# Patient Record
Sex: Female | Born: 1990 | Race: White | Hispanic: No | Marital: Single | Smoking: Never smoker
Health system: Southern US, Community
[De-identification: ages and names within clinical notes are randomized; demographics above are authoritative.]

## PROBLEM LIST (undated history)

## (undated) HISTORY — PX: CHOLECYSTECTOMY: SHX55

## (undated) HISTORY — PX: TONSILLECTOMY: SUR1361

## (undated) HISTORY — PX: FEMUR CLOSED REDUCTION: SHX939

---

## 2015-06-13 ENCOUNTER — Emergency Department (HOSPITAL_COMMUNITY): Payer: BLUE CROSS/BLUE SHIELD

## 2015-06-13 ENCOUNTER — Emergency Department (HOSPITAL_COMMUNITY)
Admission: EM | Admit: 2015-06-13 | Discharge: 2015-06-13 | Disposition: A | Discharge status: Home / Self Care | Payer: BLUE CROSS/BLUE SHIELD | Attending: Emergency Medicine | Admitting: Emergency Medicine

## 2015-06-13 ENCOUNTER — Encounter (HOSPITAL_COMMUNITY): Payer: Self-pay | Admitting: Emergency Medicine

## 2015-06-13 DIAGNOSIS — S9002XA Contusion of left ankle, initial encounter: Secondary | ICD-10-CM | POA: Insufficient documentation

## 2015-06-13 DIAGNOSIS — W1839XA Other fall on same level, initial encounter: Secondary | ICD-10-CM | POA: Insufficient documentation

## 2015-06-13 DIAGNOSIS — S82892A Other fracture of left lower leg, initial encounter for closed fracture: Secondary | ICD-10-CM | POA: Insufficient documentation

## 2015-06-13 DIAGNOSIS — Z880 Allergy status to penicillin: Secondary | ICD-10-CM | POA: Diagnosis not present

## 2015-06-13 DIAGNOSIS — Y9289 Other specified places as the place of occurrence of the external cause: Secondary | ICD-10-CM | POA: Insufficient documentation

## 2015-06-13 DIAGNOSIS — Y9389 Activity, other specified: Secondary | ICD-10-CM | POA: Insufficient documentation

## 2015-06-13 DIAGNOSIS — Y998 Other external cause status: Secondary | ICD-10-CM | POA: Diagnosis not present

## 2015-06-13 DIAGNOSIS — S99912A Unspecified injury of left ankle, initial encounter: Secondary | ICD-10-CM | POA: Diagnosis present

## 2015-06-13 MED ORDER — TRAMADOL HCL 50 MG PO TABS: 50.0000 mg | ORAL_TABLET | Freq: Four times a day (QID) | ORAL | Status: AC | PRN

## 2015-06-13 MED ORDER — TRAMADOL HCL 50 MG PO TABS
50.0000 mg | ORAL_TABLET | Freq: Once | ORAL | Status: AC
  Administered 2015-06-13: 50 mg via ORAL
  Filled 2015-06-13: qty 1

## 2015-06-13 NOTE — ED Provider Notes (Signed)
CSN: [id number]     Arrival date & time 06/13/15  1903 History  By signing my name below, I, Cindy Ferrell, attest that this documentation has been prepared under the direction and in the presence of Earley Favor, NP-C Electronically Signed: Phillis Ferrell, ED Scribe. 06/13/2015. 9:30 PM.   Chief Complaint  Patient presents with  . Ankle Injury   The history is provided by the patient. No language interpreter was used.  HPI Comments: Cindy Ferrell is a 25 y.o. female who presents to the Emergency Department complaining of a left ankle injury onset one day ago. Pt states that she fell getting onto a train last night injuring the left ankle. She reports associated bruising and swelling to the ankle and left fifth toe. She has been using RICE techniques, ACE wrap, and ibuprofen with mild relief. She states that she is able to walk with the ACE wrap, but it is difficult. She denies numbness and weakness.   History reviewed. No pertinent past medical history. Past Surgical History  Procedure Laterality Date  . Cholecystectomy    . Femur closed reduction Right   . Tonsillectomy     History reviewed. No pertinent family history. Social History  Substance Use Topics  . Smoking status: Never Smoker   . Smokeless tobacco: None  . Alcohol Use: Yes   OB History    No data available     Review of Systems  Constitutional: Negative for fever.  Musculoskeletal: Positive for joint swelling and arthralgias.  Skin: Positive for color change. Negative for wound.  Neurological: Negative for weakness and numbness.  All other systems reviewed and are negative.  Allergies  Penicillins  Home Medications   Prior to Admission medications   Medication Sig Start Date End Date Taking? Authorizing Provider  etonogestrel (NEXPLANON) 68 MG IMPL implant 1 each by Subdermal route once.   Yes Historical Provider, MD  ibuprofen (ADVIL,MOTRIN) 200 MG tablet Take 400-600 mg by mouth every 6 (six) hours as needed  (for pain).   Yes Historical Provider, MD  traMADol (ULTRAM) 50 MG tablet Take 1 tablet (50 mg total) by mouth every 6 (six) hours as needed. 06/13/15   Earley Favor, NP   BP 137/99 mmHg  Pulse 106  Temp(Src) 98.7 F (37.1 C) (Oral)  Resp 15  Ht  (1.6 m)  Wt 99.791 kg  BMI 38.98 kg/m2  SpO2 97%  LMP 04/12/2015 Physical Exam  Constitutional: She is oriented to person, place, and time. She appears well-developed and well-nourished.  HENT:  Head: Normocephalic.  Eyes: Pupils are equal, round, and reactive to light.  Neck: Normal range of motion.  Cardiovascular: Normal rate.   Pulmonary/Chest: Effort normal.  Musculoskeletal: Normal range of motion. She exhibits edema and tenderness.       Feet:  Neurological: She is alert and oriented to person, place, and time.  Nursing note and vitals reviewed.   ED Course  Procedures (including critical care time) DIAGNOSTIC STUDIES: Oxygen Saturation is 97% on RA, normal by my interpretation.    COORDINATION OF CARE: 9:06 PM-Discussed treatment plan which includes x-ray and ankle brace with pt at bedside and pt agreed to plan.    Labs Review Labs Reviewed - No data to display  Imaging Review Dg Ankle Complete Left  06/13/2015  CLINICAL DATA:  25 year old female with fall and left hip pain. EXAM: LEFT ANKLE COMPLETE - 3+ VIEW; LEFT FOOT - COMPLETE 3+ VIEW COMPARISON:  None. FINDINGS: There is a small irregular  bony fragment adjacent to the distal tip of the lateral malleolus compatible with avulsion injury. Faint linear calcification adjacent to the medial malleolus may represent an age indeterminate avulsion injury or soft tissue calcification. No other acute fracture identified. There is no dislocation. The bones are well mineralized. There is soft tissue swelling over the lateral malleolus. IMPRESSION: Avulsion injury of the distal tip of the lateral malleolus. Electronically Signed   By: Elgie Collard M.D.   On: 06/13/2015 20:12    Dg Foot Complete Left  06/13/2015  CLINICAL DATA:  25 year old female with fall and left hip pain. EXAM: LEFT ANKLE COMPLETE - 3+ VIEW; LEFT FOOT - COMPLETE 3+ VIEW COMPARISON:  None. FINDINGS: There is a small irregular bony fragment adjacent to the distal tip of the lateral malleolus compatible with avulsion injury. Faint linear calcification adjacent to the medial malleolus may represent an age indeterminate avulsion injury or soft tissue calcification. No other acute fracture identified. There is no dislocation. The bones are well mineralized. There is soft tissue swelling over the lateral malleolus. IMPRESSION: Avulsion injury of the distal tip of the lateral malleolus. Electronically Signed   By: Elgie Collard M.D.   On: 06/13/2015 20:12   I have personally reviewed and evaluated these images and lab results as part of my medical decision-making.   EKG Interpretation None    small avulsion fracture with bruising and swelling had been wrapping in ACE, ice, elevation and compression  Will place in CAM walker   MDM   Final diagnoses:  Ankle fracture, left, closed, initial encounter    I personally performed the services described in this documentation, which was scribed in my presence. The recorded information has been reviewed and is accurate.   Earley Favor, NP 06/13/15 2130  Arby Barrette, MD 06/20/15 1700

## 2015-06-13 NOTE — Discharge Instructions (Signed)
You have a small avulsion fracture this will heal without surgery but it is important that you provide stability while healing You have been place in a CAM walker that you should wear for the next 4-5 weeks while up and about You can take it off for bathing and sleeping

## 2015-06-13 NOTE — ED Notes (Signed)
Pt states that she fell last night and now has bruising and swelling on L ankle and L pinky toe. Alert and oriented.

## 2015-06-13 NOTE — ED Notes (Signed)
Ortho Tech called. 

## 2016-09-22 IMAGING — CR DG FOOT COMPLETE 3+V*L*
3 series · 3 of 3 positions shown · non-contrast
Comparison: None.

CLINICAL DATA: 24-year-old female with fall and left hip pain.

EXAM:
LEFT ANKLE COMPLETE - 3+ VIEW; LEFT FOOT - COMPLETE 3+ VIEW

[x foot ap left]
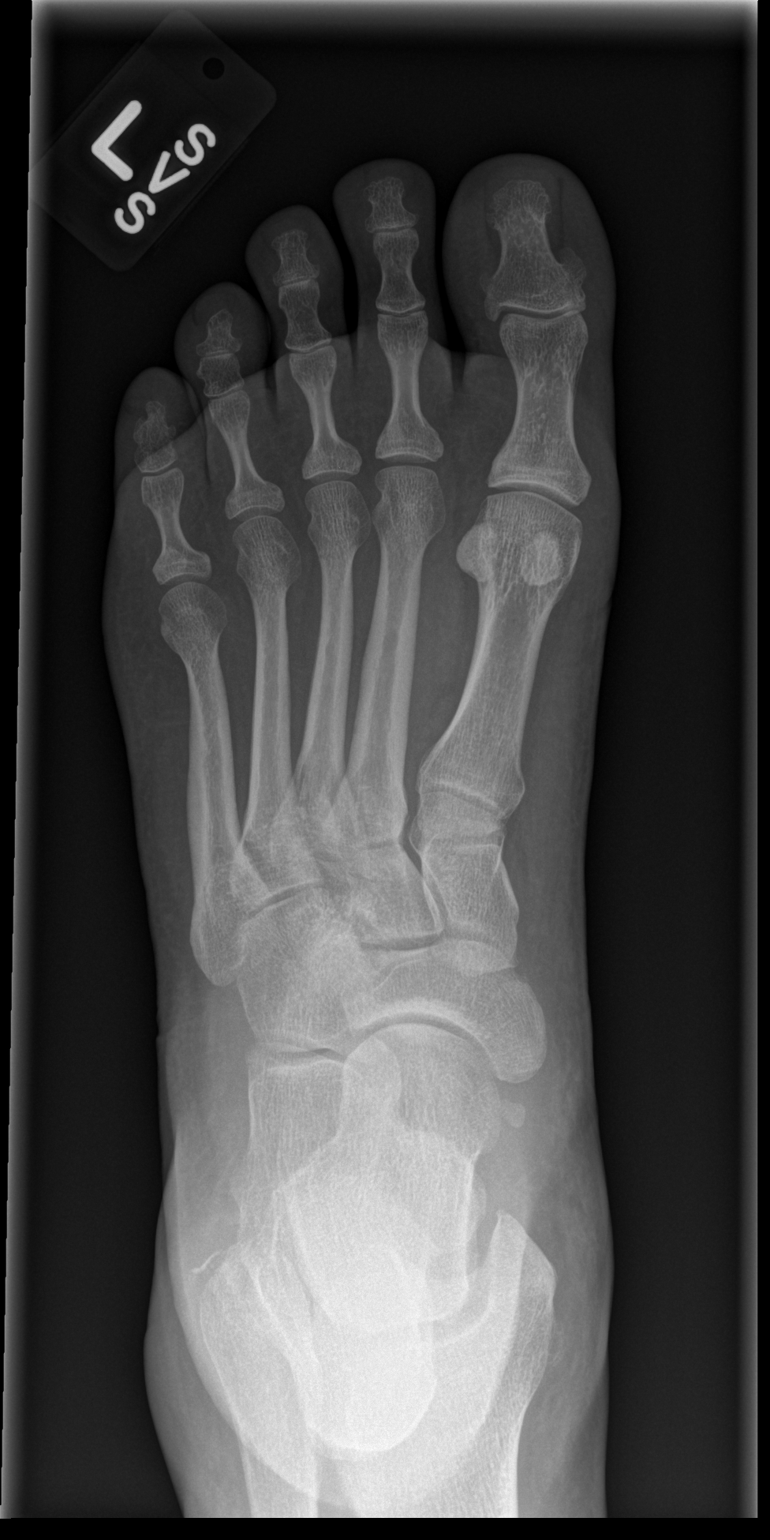

[x foot obl left]
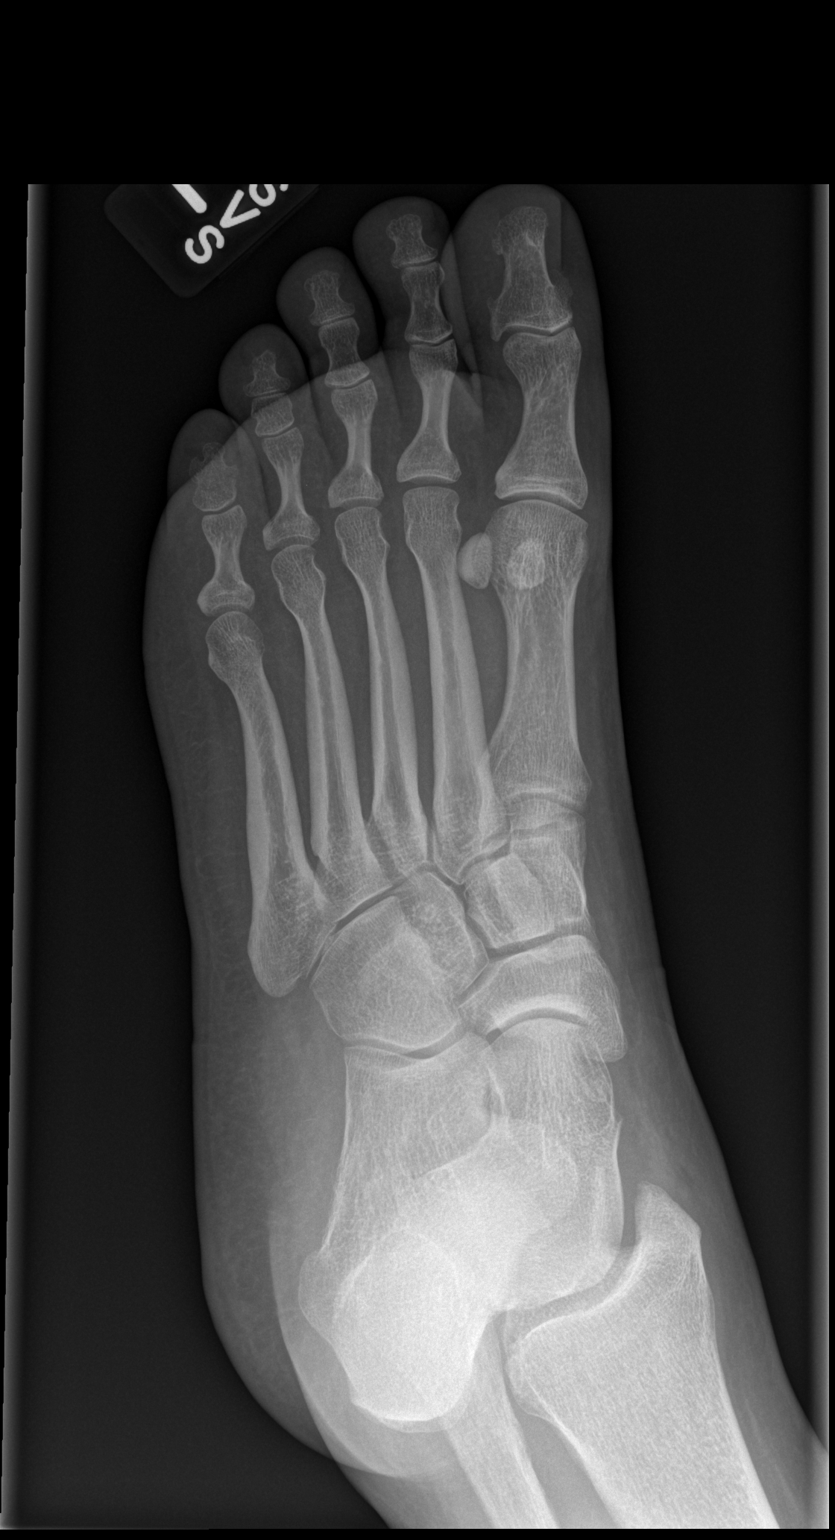

[x foot lat left]
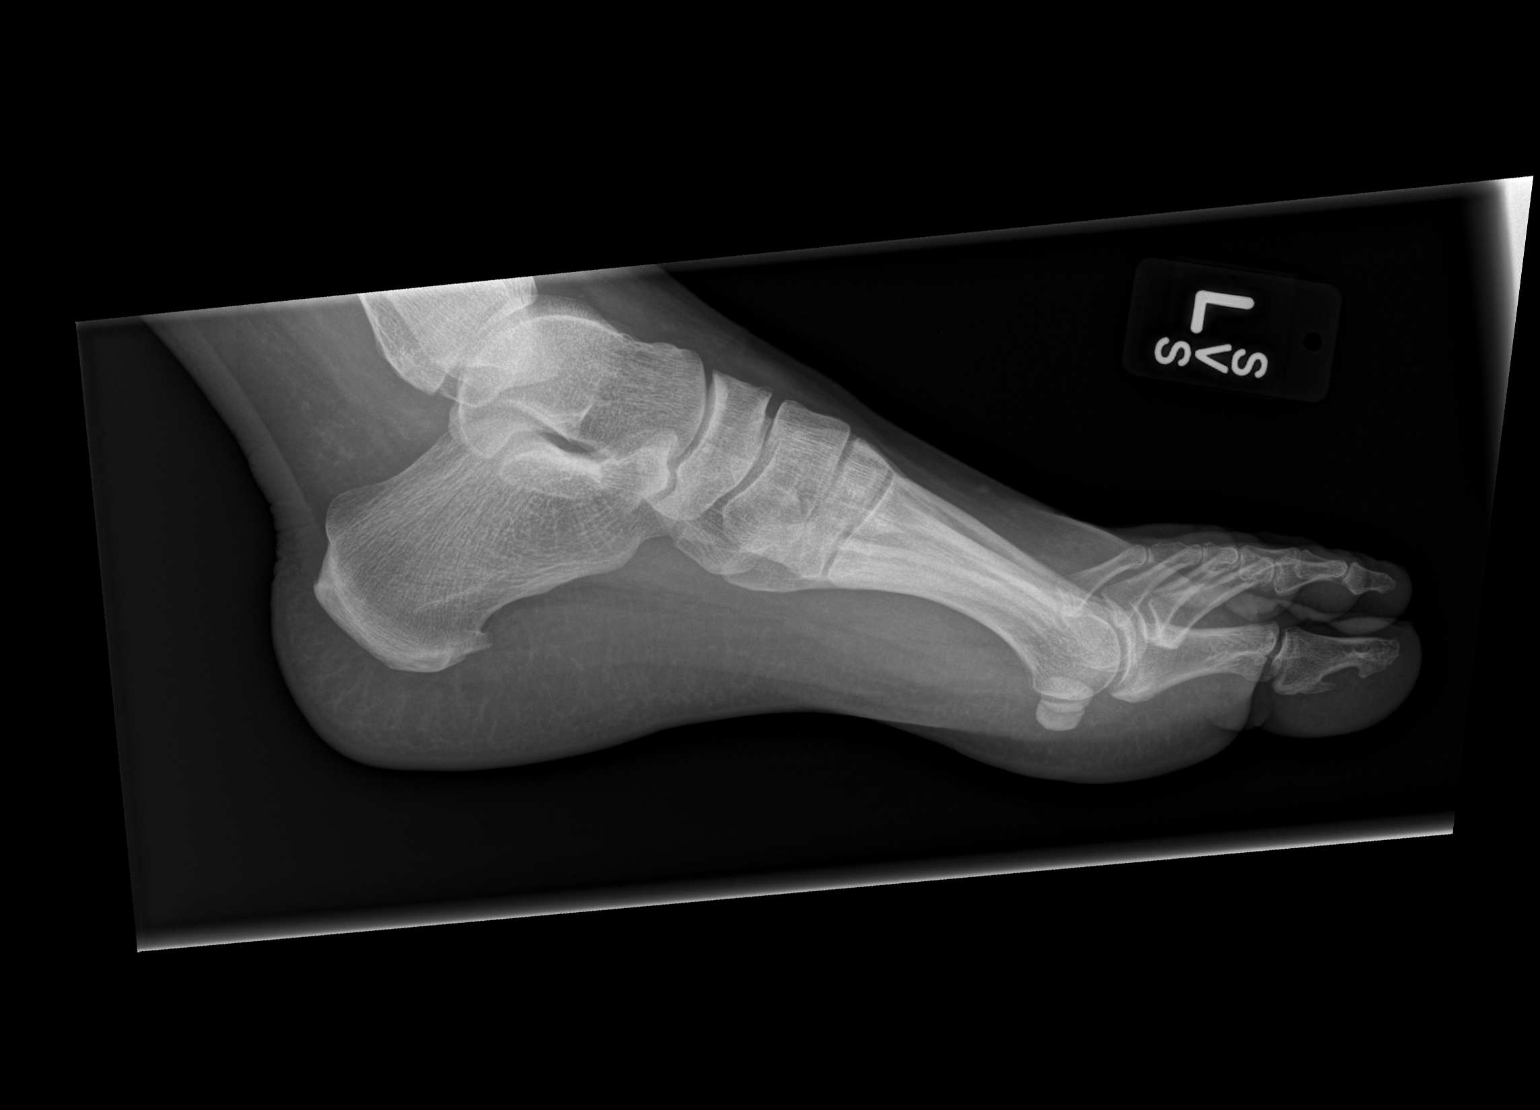

[3 of 3 positions shown; findings below may reference images not displayed]

FINDINGS: There is a small irregular bony fragment adjacent to the distal tip
of the lateral malleolus compatible with avulsion injury. Faint
linear calcification adjacent to the medial malleolus may represent
an age indeterminate avulsion injury or soft tissue calcification.
No other acute fracture identified. There is no dislocation. The
bones are well mineralized. There is soft tissue swelling over the
lateral malleolus.
IMPRESSION: Avulsion injury of the distal tip of the lateral malleolus.

## 2022-10-06 DEATH — deceased
